# Patient Record
Sex: Male | Born: 1985 | Race: Black or African American | Hispanic: No | Marital: Single | Smoking: Never smoker
Health system: Southern US, Community
[De-identification: ages and names within clinical notes are randomized; demographics above are authoritative.]

---

## 2006-12-25 ENCOUNTER — Ambulatory Visit: Payer: Self-pay | Admitting: Emergency Medicine

## 2007-06-03 ENCOUNTER — Emergency Department: Payer: Self-pay | Admitting: Emergency Medicine

## 2007-08-02 ENCOUNTER — Emergency Department (HOSPITAL_COMMUNITY): Admission: EM | Admit: 2007-08-02 | Discharge: 2007-08-03 | Payer: Self-pay | Admitting: Emergency Medicine

## 2010-03-17 ENCOUNTER — Emergency Department: Payer: Self-pay | Admitting: Emergency Medicine

## 2010-04-11 ENCOUNTER — Ambulatory Visit: Payer: Self-pay | Admitting: Orthopedic Surgery

## 2010-07-26 ENCOUNTER — Emergency Department: Payer: Self-pay | Admitting: Emergency Medicine

## 2010-12-03 ENCOUNTER — Emergency Department: Payer: Self-pay | Admitting: Emergency Medicine

## 2012-01-22 ENCOUNTER — Emergency Department: Payer: Self-pay | Admitting: Unknown Physician Specialty

## 2012-05-27 ENCOUNTER — Ambulatory Visit: Payer: Self-pay | Admitting: Family Medicine

## 2014-01-01 ENCOUNTER — Emergency Department: Payer: Self-pay | Admitting: Student

## 2017-05-04 ENCOUNTER — Emergency Department
Admission: EM | Admit: 2017-05-04 | Discharge: 2017-05-04 | Disposition: A | Discharge status: Home / Self Care | Payer: Self-pay | Attending: Emergency Medicine | Admitting: Emergency Medicine

## 2017-05-04 ENCOUNTER — Other Ambulatory Visit: Payer: Self-pay

## 2017-05-04 ENCOUNTER — Encounter: Payer: Self-pay | Admitting: Emergency Medicine

## 2017-05-04 ENCOUNTER — Emergency Department: Payer: Self-pay

## 2017-05-04 DIAGNOSIS — Y939 Activity, unspecified: Secondary | ICD-10-CM | POA: Insufficient documentation

## 2017-05-04 DIAGNOSIS — Y929 Unspecified place or not applicable: Secondary | ICD-10-CM | POA: Insufficient documentation

## 2017-05-04 DIAGNOSIS — S93402A Sprain of unspecified ligament of left ankle, initial encounter: Secondary | ICD-10-CM | POA: Insufficient documentation

## 2017-05-04 DIAGNOSIS — X509XXA Other and unspecified overexertion or strenuous movements or postures, initial encounter: Secondary | ICD-10-CM | POA: Insufficient documentation

## 2017-05-04 DIAGNOSIS — Y999 Unspecified external cause status: Secondary | ICD-10-CM | POA: Insufficient documentation

## 2017-05-04 MED ORDER — TRAMADOL HCL 50 MG PO TABS: 50.0000 mg | ORAL_TABLET | Freq: Four times a day (QID) | ORAL | 0 refills | Status: DC | PRN

## 2017-05-04 MED ORDER — NAPROXEN 500 MG PO TABS: 500.0000 mg | ORAL_TABLET | Freq: Two times a day (BID) | ORAL | 0 refills | Status: DC

## 2017-05-04 NOTE — ED Provider Notes (Signed)
[facility] Emergency Department Provider Note   ____________________________________________   First MD Initiated Contact with Patient 05/04/17 1636     (approximate)  I have reviewed the triage vital signs and the nursing notes.   HISTORY  Chief Complaint Ankle Pain    HPI Brendan Thompson is a 32 y.o. male patient complain left medial ankle pain secondary to a twisting incident yesterday.  Patient states stepped on another person's foot.  Patient state pain with ambulation.  Patient rates his pain as 8/10.  Patient describes the pain as "achy".  No pulses measured for complaint.  History reviewed. No pertinent past medical history.  There are no active problems to display for this patient.   History reviewed. No pertinent surgical history.  Prior to Admission medications   Medication Sig Start Date End Date Taking? Authorizing Provider  naproxen (NAPROSYN) 500 MG tablet Take 1 tablet (500 mg total) by mouth 2 (two) times daily with a meal. 05/04/17   Joni ReiningSmith, Jamela Cumbo K, PA-C  traMADol (ULTRAM) 50 MG tablet Take 1 tablet (50 mg total) by mouth every 6 (six) hours as needed for moderate pain. 05/04/17   Joni ReiningSmith, Shaquila Sigman K, PA-C    Allergies Patient has no known allergies.  No family history on file.  Social History Social History   Tobacco Use  . Smoking status: Never Smoker  . Smokeless tobacco: Never Used  Substance Use Topics  . Alcohol use: No    Frequency: Never  . Drug use: Not on file    Review of Systems Constitutional: No fever/chills Eyes: No visual changes. ENT: No sore throat. Cardiovascular: Denies chest pain. Respiratory: Denies shortness of breath. Gastrointestinal: No abdominal pain.  No nausea, no vomiting.  No diarrhea.  No constipation. Genitourinary: Negative for dysuria. Musculoskeletal: Left ankle pain Skin: Negative for rash. Neurological: Negative for headaches, focal weakness or  numbness.   ____________________________________________   PHYSICAL EXAM:  VITAL SIGNS: ED Triage Vitals [05/04/17 1628]  Enc Vitals Group     BP      Pulse Rate 80     Resp 20     Temp 98.2 F (36.8 C)     Temp Source Oral     SpO2 96 %     Weight 285 lb (129.3 kg)     Height 5\' 9"  (1.753 m)     Head Circumference      Peak Flow      Pain Score 8     Pain Loc      Pain Edu?      Excl. in GC?     Constitutional: Alert and oriented. Well appearing and in no acute distress. Cardiovascular: Normal rate, regular rhythm. Grossly normal heart sounds.  Good peripheral circulation. Respiratory: Normal respiratory effort.  No retractions. Lungs CTAB. Musculoskeletal: No lower extremity tenderness nor edema.  No joint effusions. Neurologic:  Normal speech and language. No gross focal neurologic deficits are appreciated. No gait instability. Skin:  Skin is warm, dry and intact. No rash noted. Psychiatric: Mood and affect are normal. Speech and behavior are normal.  ____________________________________________   LABS (all labs ordered are listed, but only abnormal results are displayed)  Labs Reviewed - No data to display ____________________________________________  EKG   ____________________________________________  RADIOLOGY  No acute findings on x-ray of the left ankle.  Mild degenerative changes. ____________________________________________   PROCEDURES  Procedure(s) performed: None  .Splint Application Date/Time: 05/04/2017 5:11 PM Performed by: Marilynn RailWilloughby, Leslie B, NT Authorized  by: Joni Reining, PA-C   Consent:    Consent obtained:  Verbal   Consent given by:  Patient   Risks discussed:  Swelling and pain Pre-procedure details:    Sensation:  Normal Procedure details:    Laterality:  Left   Location:  Ankle   Ankle:  L ankle   Splint type:  Ankle stirrup   Supplies:  Prefabricated splint Post-procedure details:    Pain:  Unchanged    Sensation:  Normal   Patient tolerance of procedure:  Tolerated well, no immediate complications     Critical Care performed: No  ____________________________________________   INITIAL IMPRESSION / ASSESSMENT AND PLAN / ED COURSE  As part of my medical decision making, I reviewed the following data within the electronic MEDICAL RECORD NUMBER    Left ankle pain secondary to sprain.  Discussed x-ray findings with patient.  Patient placed in a ankle splint.  Patient given discharge care instruction advised take medication as directed.  Patient [facility].      ____________________________________________   FINAL CLINICAL IMPRESSION(S) / ED DIAGNOSES  Final diagnoses:  Sprain of left ankle, unspecified ligament, initial encounter     ED Discharge Orders        Ordered    naproxen (NAPROSYN) 500 MG tablet  2 times daily with meals     05/04/17 1713    traMADol (ULTRAM) 50 MG tablet  Every 6 hours PRN     05/04/17 1713       Note:  This document was prepared using Dragon voice recognition software and may include unintentional dictation errors.    Joni Reining, PA-C 05/04/17 1716    Loleta Rose, MD 05/04/17 1721

## 2017-05-04 NOTE — Discharge Instructions (Signed)
Ankle splint for 3-5 days as needed. °

## 2017-05-04 NOTE — ED Triage Notes (Signed)
Presents with left ankle pain  States he stepped on another person's foot  Twisted ankle yesterday

## 2017-10-16 ENCOUNTER — Other Ambulatory Visit: Payer: Self-pay

## 2017-10-16 ENCOUNTER — Emergency Department: Payer: Self-pay

## 2017-10-16 ENCOUNTER — Encounter: Payer: Self-pay | Admitting: Emergency Medicine

## 2017-10-16 DIAGNOSIS — S8011XA Contusion of right lower leg, initial encounter: Secondary | ICD-10-CM | POA: Insufficient documentation

## 2017-10-16 DIAGNOSIS — Y939 Activity, unspecified: Secondary | ICD-10-CM | POA: Insufficient documentation

## 2017-10-16 DIAGNOSIS — Y999 Unspecified external cause status: Secondary | ICD-10-CM | POA: Insufficient documentation

## 2017-10-16 DIAGNOSIS — W2103XA Struck by baseball, initial encounter: Secondary | ICD-10-CM | POA: Insufficient documentation

## 2017-10-16 DIAGNOSIS — Y929 Unspecified place or not applicable: Secondary | ICD-10-CM | POA: Insufficient documentation

## 2017-10-16 NOTE — ED Triage Notes (Signed)
Pt arrives ambulatory to triage with c/o left leg pain. Pt reports being hit with a baseball x 1 week and having some swelling to site. Pt is in NAD.

## 2017-10-17 ENCOUNTER — Emergency Department
Admission: EM | Admit: 2017-10-17 | Discharge: 2017-10-17 | Disposition: A | Discharge status: Home / Self Care | Payer: Self-pay | Attending: Emergency Medicine | Admitting: Emergency Medicine

## 2017-10-17 DIAGNOSIS — S8011XA Contusion of right lower leg, initial encounter: Secondary | ICD-10-CM

## 2017-10-17 MED ORDER — LIDOCAINE 5 % EX PTCH
MEDICATED_PATCH | CUTANEOUS | Status: AC
  Filled 2017-10-17: qty 1

## 2017-10-17 MED ORDER — LIDOCAINE 5 % EX PTCH: 1.0000 | MEDICATED_PATCH | CUTANEOUS | 0 refills | Status: DC

## 2017-10-17 MED ORDER — LIDOCAINE 5 % EX PTCH
1.0000 | MEDICATED_PATCH | CUTANEOUS | Status: DC
  Administered 2017-10-17: 1 via TRANSDERMAL

## 2017-10-17 NOTE — ED Provider Notes (Signed)
[facility] Emergency Department Provider Note _____   First MD Initiated Contact with Patient 10/17/17 0057     (approximate)  I have reviewed the triage vital signs and the nursing notes.   HISTORY  Chief Complaint Leg Pain    HPI Brendan Thompson is a 32 y.o. male presents to the emergency department with history of being struck on the right lower extremity by baseball 1 week ago with resultant pain and swelling on the anterior lower leg since then.  Patient states no pain unless area is palpated.   Past medical history None There are no active problems to display for this patient.   Past surgical history None  Prior to Admission medications   Medication Sig Start Date End Date Taking? Authorizing Provider  naproxen (NAPROSYN) 500 MG tablet Take 1 tablet (500 mg total) by mouth 2 (two) times daily with a meal. 05/04/17   Joni ReiningSmith, Ronald K, PA-C  traMADol (ULTRAM) 50 MG tablet Take 1 tablet (50 mg total) by mouth every 6 (six) hours as needed for moderate pain. 05/04/17   Joni ReiningSmith, Ronald K, PA-C    Allergies No drug allergies No family history on file.  Social History Social History   Tobacco Use  . Smoking status: Never Smoker  . Smokeless tobacco: Never Used  Substance Use Topics  . Alcohol use: No    Frequency: Never  . Drug use: Never    Review of Systems Constitutional: No fever/chills Eyes: No visual changes. ENT: No sore throat. Cardiovascular: Denies chest pain. Respiratory: Denies shortness of breath. Gastrointestinal: No abdominal pain.  No nausea, no vomiting.  No diarrhea.  No constipation. Genitourinary: Negative for dysuria. Musculoskeletal: Negative for neck pain.  Negative for back pain.  Positive for right lower extremity pain and swelling Integumentary: Negative for rash. Neurological: Negative for headaches, focal weakness or numbness.   ____________________________________________   PHYSICAL EXAM:  VITAL  SIGNS: ED Triage Vitals [10/16/17 2312]  Enc Vitals Group     BP (!) 168/100     Pulse Rate 88     Resp 18     Temp 97.9 F (36.6 C)     Temp Source Oral     SpO2 96 %     Weight 117.9 kg (260 lb)     Height 1.753 m (5\' 9" )     Head Circumference      Peak Flow      Pain Score 0     Pain Loc      Pain Edu?      Excl. in GC?     Constitutional: Alert and oriented. Well appearing and in no acute distress. Eyes: Conjunctivae are normal.  Head: Atraumatic. Mouth/Throat: Mucous membranes are moist.  Oropharynx non-erythematous. Neck: No stridor.   Musculoskeletal: No lower extremity tenderness nor edema. No gross deformities of extremities. Neurologic:  Normal speech and language. No gross focal neurologic deficits are appreciated.  Skin: Positive for anterior right lower leg ecchymoses tender to palpation. Psychiatric: Mood and affect are normal. Speech and behavior are normal.   RADIOLOGY I, [place] Danzell Birky, personally viewed and evaluated these images (plain radiographs) as part of my medical decision making, as well as reviewing the written report by the radiologist.   ED MD interpretation: Negative x-ray of the right tibia/fibula.  Official radiology report(s): Dg Tibia/fibula Right  Result Date: 10/17/2017 CLINICAL DATA:  Right leg trauma. EXAM: RIGHT TIBIA AND FIBULA - 2 VIEW COMPARISON:  None. FINDINGS:  There is no evidence of fracture or other focal bone lesions. Soft tissues are unremarkable. IMPRESSION: Negative. Electronically Signed   By: Deatra Robinson M.D.   On: 10/17/2017 00:03     Procedures   ____________________________________________   INITIAL IMPRESSION / ASSESSMENT AND PLAN / ED COURSE  As part of my medical decision making, I reviewed the following data within the electronic MEDICAL RECORD NUMBER   32 year old male presented with above-stated history and physical exam secondary to right lower leg injury from a baseball a week ago.  History and  physical exam consistent with contusion with possible subcutaneous hematoma in the area.  Lidoderm patch applied.    ____________________________________________  FINAL CLINICAL IMPRESSION(S) / ED DIAGNOSES  Final diagnoses:  Contusion of right lower extremity, initial encounter     MEDICATIONS GIVEN DURING THIS VISIT:  Medications  lidocaine (LIDODERM) 5 % 1 patch (has no administration in time range)     ED Discharge Orders    None       Note:  This document was prepared using Dragon voice recognition software and may include unintentional dictation errors.    Darci Current, MD 10/17/17 [phone]

## 2018-02-05 ENCOUNTER — Emergency Department
Admission: EM | Admit: 2018-02-05 | Discharge: 2018-02-05 | Disposition: A | Discharge status: Home / Self Care | Payer: Self-pay | Attending: Emergency Medicine | Admitting: Emergency Medicine

## 2018-02-05 ENCOUNTER — Other Ambulatory Visit: Payer: Self-pay

## 2018-02-05 DIAGNOSIS — L247 Irritant contact dermatitis due to plants, except food: Secondary | ICD-10-CM | POA: Insufficient documentation

## 2018-02-05 MED ORDER — PREDNISONE 10 MG PO TABS: ORAL_TABLET | ORAL | 0 refills | Status: AC

## 2018-02-05 MED ORDER — DEXAMETHASONE SODIUM PHOSPHATE 10 MG/ML IJ SOLN
10.0000 mg | Freq: Once | INTRAMUSCULAR | Status: AC
  Administered 2018-02-05: 10 mg via INTRAMUSCULAR
  Filled 2018-02-05: qty 1

## 2018-02-05 NOTE — ED Triage Notes (Signed)
Blistered type rash to body X 3 days. Itching. Denies pain. Pt alert and oriented X4, active, cooperative, pt in NAD. RR even and unlabored, color WNL.

## 2018-02-05 NOTE — ED Provider Notes (Signed)
[facility] Emergency Department Provider Note   ____________________________________________   First MD Initiated Contact with Patient 02/05/18 1008     (approximate)  I have reviewed the triage vital signs and the nursing notes.   HISTORY  Chief Complaint Rash   HPI Brendan Thompson is a 32 y.o. male presents to the ED with complaint of rash.  Patient states that he has had a rash that seems to be spreading over his body for the last 3 days.  It initially started after he had cleared a lot.  He states the itching is bad and that he has been using calamine lotion with minimal relief.  He denies any pain.   History reviewed. No pertinent past medical history.  There are no active problems to display for this patient.   History reviewed. No pertinent surgical history.  Prior to Admission medications   Medication Sig Start Date End Date Taking? Authorizing Provider  predniSONE (DELTASONE) 10 MG tablet Take 6 tablets  today, on day 2 take 5 tablets, day 3 take 4 tablets, day 4 take 3 tablets, day 5 take  2 tablets and 1 tablet the last day 02/05/18   Tommi Rumps, PA-C    Allergies Patient has no known allergies.  No family history on file.  Social History Social History   Tobacco Use  . Smoking status: Never Smoker  . Smokeless tobacco: Never Used  Substance Use Topics  . Alcohol use: No    Frequency: Never  . Drug use: Never    Review of Systems Constitutional: No fever/chills Cardiovascular: Denies chest pain. Respiratory: Denies shortness of breath. Musculoskeletal: Negative for back pain. Skin: Positive for rash. Neurological: Negative for headaches, focal weakness or numbness. ___________________________________________   PHYSICAL EXAM:  VITAL SIGNS: ED Triage Vitals  Enc Vitals Group     BP 02/05/18 0936 (!) 159/79     Pulse Rate 02/05/18 0936 79     Resp 02/05/18 0936 16     Temp 02/05/18 0936 97.6 F (36.4 C)   Temp Source 02/05/18 0936 Oral     SpO2 --      Weight 02/05/18 0936 270 lb (122.5 kg)     Height 02/05/18 0936 5\' 9"  (1.753 m)     Head Circumference --      Peak Flow --      Pain Score 02/05/18 0935 0     Pain Loc --      Pain Edu? --      Excl. in GC? --    Constitutional: Alert and oriented. Well appearing and in no acute distress. Eyes: Conjunctivae are normal. PERRL. EOMI. Head: Atraumatic. Nose: No congestion/rhinnorhea. Neck: No stridor.   Cardiovascular: Normal rate, regular rhythm. Grossly normal heart sounds.  Good peripheral circulation. Respiratory: Normal respiratory effort.  No retractions. Lungs CTAB. Musculoskeletal: No lower extremity tenderness nor edema.  No joint effusions. Neurologic:  Normal speech and language. No gross focal neurologic deficits are appreciated. No gait instability. Skin:  Skin is warm.  Multiple fascicular macular areas with erythematous bases are located on the face upper extremities trunk and groin area.  There is also some diffuse involvement on the upper portion of the anterior lower extremities.  On the face there is also a linear macular areas with small vesicles present.  Patient also has calamine lotion on the trunk areas most involved. Psychiatric: Mood and affect are normal. Speech and behavior are normal.  ____________________________________________   LABS (all  labs ordered are listed, but only abnormal results are displayed)  Labs Reviewed - No data to display  PROCEDURES  Procedure(s) performed: None  Procedures  Critical Care performed: No  ____________________________________________   INITIAL IMPRESSION / ASSESSMENT AND PLAN / ED COURSE  As part of my medical decision making, I reviewed the following data within the electronic MEDICAL RECORD NUMBER Notes from prior ED visits and [place] Controlled Substance Database  Patient presents with a contact dermatitis after clearing a lot 3 days ago.  He states that the itching is  keeping him awake at night.  Areas involved include face, neck, trunk and extremities.  This is consistent with poison oak, poison ivy or sumac.  Patient was given Decadron 10 mg IM and to begin taking a prednisone Dosepak.  He is also encouraged to take Benadryl 1 or 2 tablets every 6 hours for the next 2 days to help control itching.  He was given a note to remain out of work for 2 days.  ____________________________________________   FINAL CLINICAL IMPRESSION(S) / ED DIAGNOSES  Final diagnoses:  Irritant contact dermatitis due to plants, except food     ED Discharge Orders         Ordered    predniSONE (DELTASONE) 10 MG tablet     02/05/18 1056           Note:  This document was prepared using Dragon voice recognition software and may include unintentional dictation errors.    Tommi Rumps, PA-C 02/05/18 1108    Rockne Menghini, MD 02/05/18 1134

## 2018-02-05 NOTE — Discharge Instructions (Addendum)
Follow-up with your primary care provider or [facility] acute care if any continued problems.  Begin taking prednisone as directed starting today with 6 tablets and tapering down by 1 tablet each day.  You also need to obtain Benadryl over-the-counter.  1 or 2 every 6 hours as needed for itching.  This medication could cause drowsiness and increase your risk for injury.  Do not take this medication while driving. Avoid scratching this area.  Do not take hot showers as heat will cause these areas to itch more.

## 2018-02-05 NOTE — ED Notes (Signed)
See triage note  Presents with rash to hands,face and legs  States he thinks he may have gotten into some poison ivy

## 2018-12-10 ENCOUNTER — Other Ambulatory Visit: Payer: Self-pay

## 2018-12-10 DIAGNOSIS — Z20822 Contact with and (suspected) exposure to covid-19: Secondary | ICD-10-CM

## 2018-12-12 LAB — NOVEL CORONAVIRUS, NAA: SARS-CoV-2, NAA: NOT DETECTED

## 2019-01-20 ENCOUNTER — Telehealth: Payer: Self-pay | Admitting: General Practice

## 2019-01-20 NOTE — Telephone Encounter (Signed)
Negative COVID results given. Patient results "NOT Detected." Caller expressed understanding. ° °

## 2020-01-08 IMAGING — CR DG TIBIA/FIBULA 2V*R*
1 series · 2 of 2 positions shown · non-contrast
Comparison: None.

CLINICAL DATA: Right leg trauma.

EXAM:
RIGHT TIBIA AND FIBULA - 2 VIEW

[Series 1: x tib-fib ap right · 0.14mm/px · 2 of 2 slices shown]
[im 1/2]
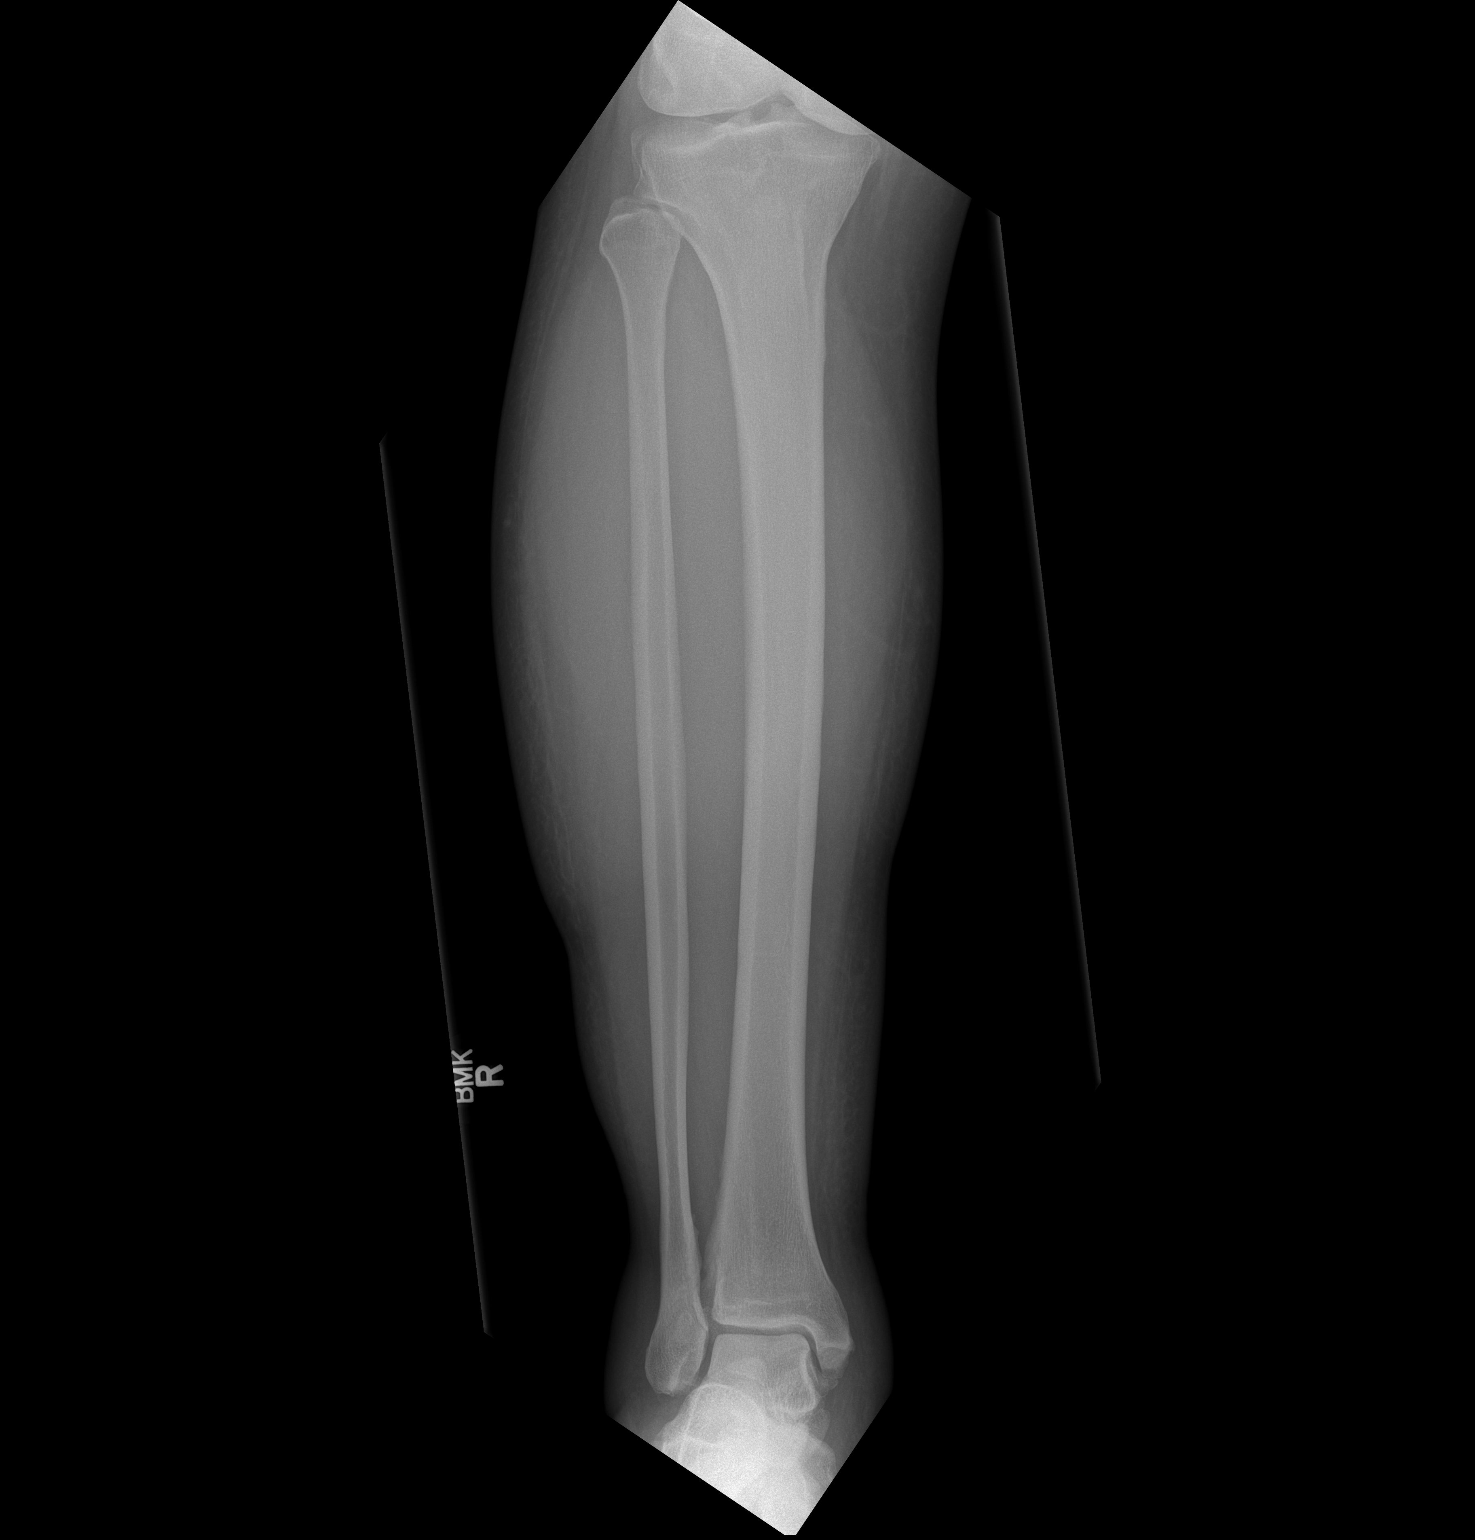
[im 2/2]
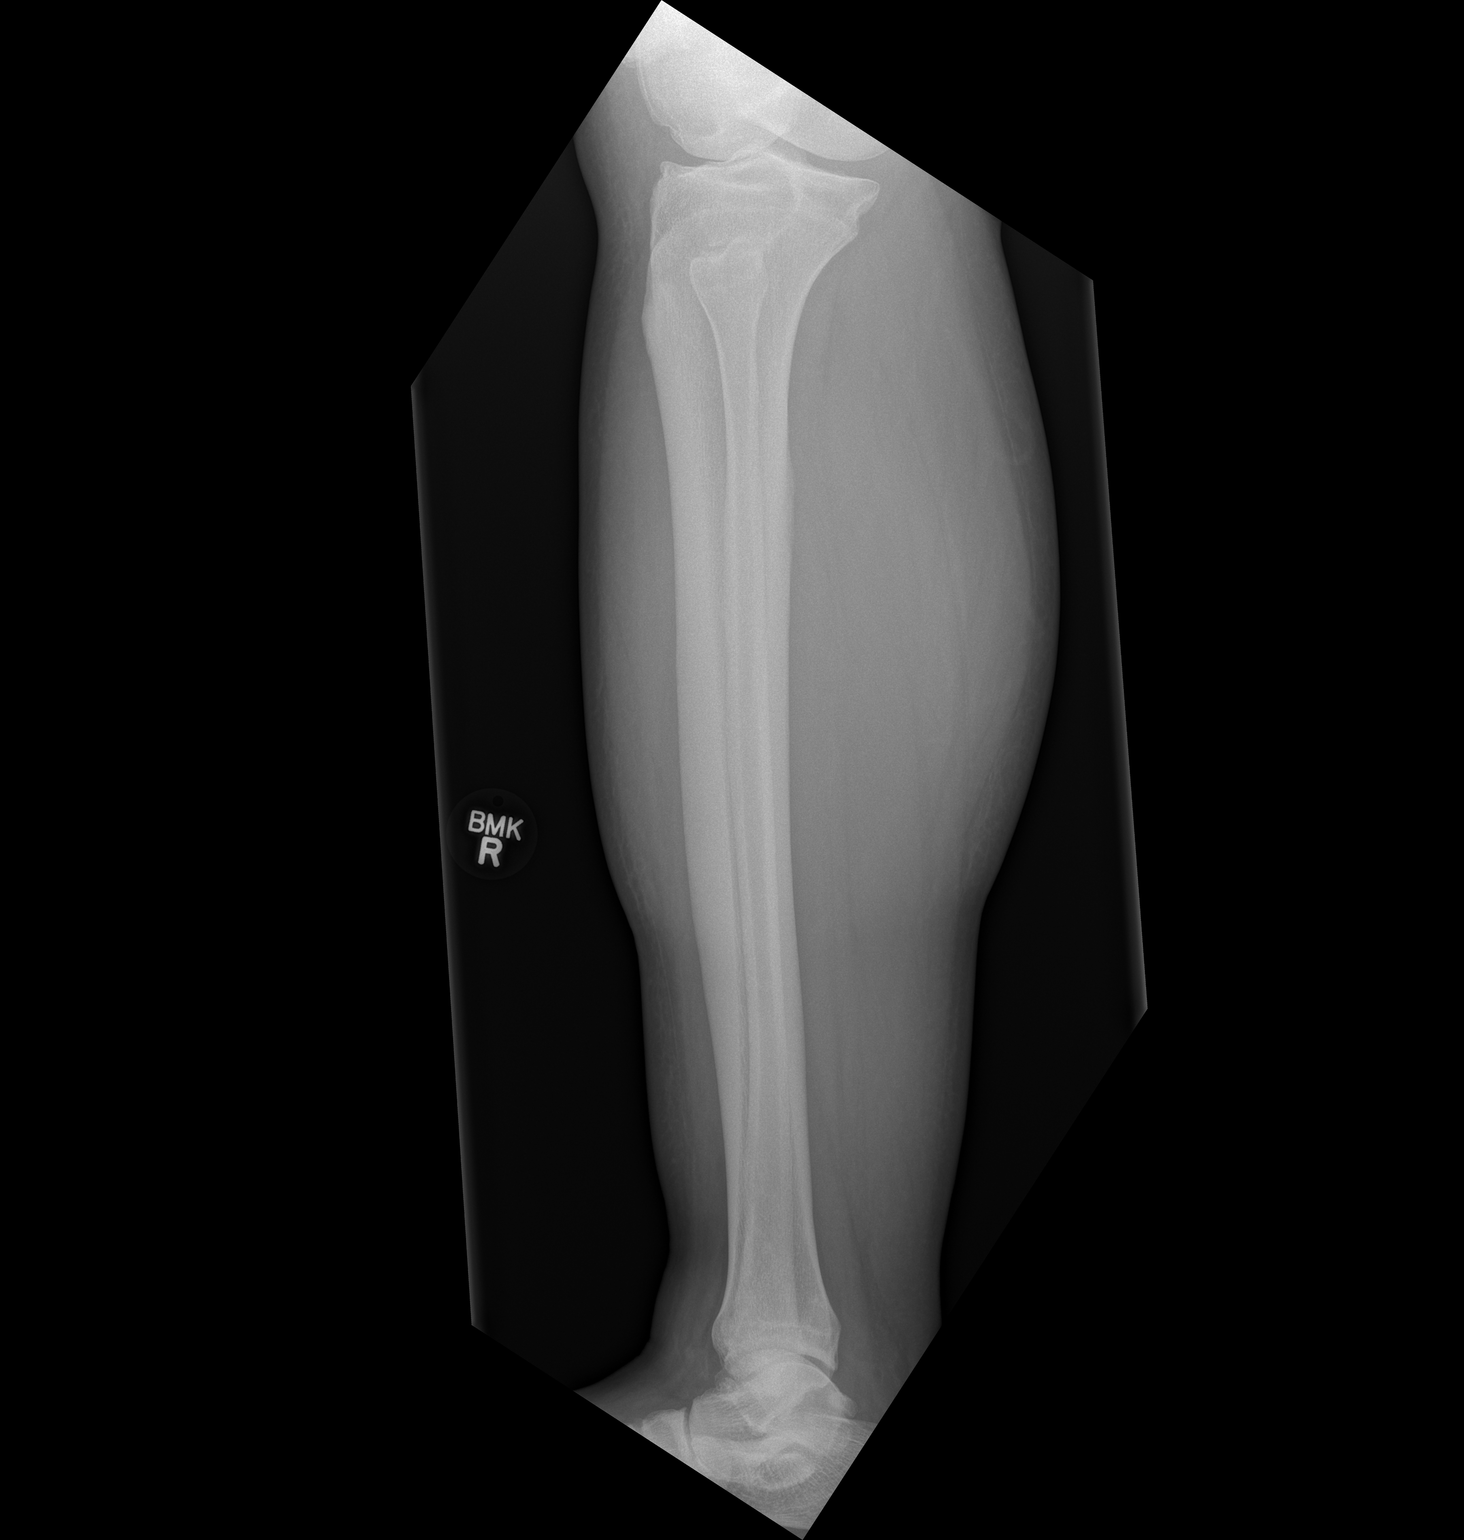

[2 of 2 positions shown; findings below may reference images not displayed]

FINDINGS: There is no evidence of fracture or other focal bone lesions. Soft
tissues are unremarkable.
IMPRESSION: Negative.
